# Patient Record
Sex: Male | Born: 1960 | Race: White | Hispanic: No | Smoking: Current every day smoker
Health system: Southern US, Community
[De-identification: ages and names within clinical notes are randomized; demographics above are authoritative.]

## PROBLEM LIST (undated history)

## (undated) DIAGNOSIS — E7800 Pure hypercholesterolemia, unspecified: Secondary | ICD-10-CM

## (undated) DIAGNOSIS — D496 Neoplasm of unspecified behavior of brain: Secondary | ICD-10-CM

## (undated) DIAGNOSIS — G8929 Other chronic pain: Secondary | ICD-10-CM

## (undated) DIAGNOSIS — F419 Anxiety disorder, unspecified: Secondary | ICD-10-CM

## (undated) DIAGNOSIS — M549 Dorsalgia, unspecified: Secondary | ICD-10-CM

## (undated) HISTORY — PX: BRAIN TUMOR EXCISION: SHX577

---

## 2007-10-28 ENCOUNTER — Ambulatory Visit: Payer: Self-pay | Admitting: Cardiology

## 2012-02-03 ENCOUNTER — Encounter (HOSPITAL_BASED_OUTPATIENT_CLINIC_OR_DEPARTMENT_OTHER): Payer: Self-pay | Admitting: *Deleted

## 2012-02-03 ENCOUNTER — Emergency Department (HOSPITAL_BASED_OUTPATIENT_CLINIC_OR_DEPARTMENT_OTHER): Payer: BC Managed Care – PPO

## 2012-02-03 ENCOUNTER — Emergency Department (HOSPITAL_BASED_OUTPATIENT_CLINIC_OR_DEPARTMENT_OTHER)
Admission: EM | Admit: 2012-02-03 | Discharge: 2012-02-03 | Disposition: A | Discharge status: Home / Self Care | Payer: BC Managed Care – PPO | Attending: Emergency Medicine | Admitting: Emergency Medicine

## 2012-02-03 DIAGNOSIS — F411 Generalized anxiety disorder: Secondary | ICD-10-CM | POA: Insufficient documentation

## 2012-02-03 DIAGNOSIS — K2970 Gastritis, unspecified, without bleeding: Secondary | ICD-10-CM

## 2012-02-03 DIAGNOSIS — E7800 Pure hypercholesterolemia, unspecified: Secondary | ICD-10-CM | POA: Insufficient documentation

## 2012-02-03 DIAGNOSIS — F17200 Nicotine dependence, unspecified, uncomplicated: Secondary | ICD-10-CM | POA: Insufficient documentation

## 2012-02-03 DIAGNOSIS — Z79899 Other long term (current) drug therapy: Secondary | ICD-10-CM | POA: Insufficient documentation

## 2012-02-03 DIAGNOSIS — G8929 Other chronic pain: Secondary | ICD-10-CM | POA: Insufficient documentation

## 2012-02-03 DIAGNOSIS — R002 Palpitations: Secondary | ICD-10-CM | POA: Insufficient documentation

## 2012-02-03 DIAGNOSIS — R109 Unspecified abdominal pain: Secondary | ICD-10-CM

## 2012-02-03 HISTORY — DX: Anxiety disorder, unspecified: F41.9

## 2012-02-03 HISTORY — DX: Neoplasm of unspecified behavior of brain: D49.6

## 2012-02-03 HISTORY — DX: Pure hypercholesterolemia, unspecified: E78.00

## 2012-02-03 HISTORY — DX: Dorsalgia, unspecified: M54.9

## 2012-02-03 HISTORY — DX: Other chronic pain: G89.29

## 2012-02-03 LAB — URINE MICROSCOPIC-ADD ON

## 2012-02-03 LAB — CBC WITH DIFFERENTIAL/PLATELET
Basophils Relative: 0 % (ref 0–1)
HCT: 47.8 % (ref 39.0–52.0)
Hemoglobin: 16 g/dL (ref 13.0–17.0)
Lymphocytes Relative: 17 % (ref 12–46)
Lymphs Abs: 2.8 10*3/uL (ref 0.7–4.0)
MCHC: 33.5 g/dL (ref 30.0–36.0)
Monocytes Absolute: 1.3 10*3/uL — ABNORMAL HIGH (ref 0.1–1.0)
Monocytes Relative: 8 % (ref 3–12)
Neutro Abs: 12.4 10*3/uL — ABNORMAL HIGH (ref 1.7–7.7)
Neutrophils Relative %: 75 % (ref 43–77)
RBC: 5.72 MIL/uL (ref 4.22–5.81)

## 2012-02-03 LAB — URINALYSIS, ROUTINE W REFLEX MICROSCOPIC
Hgb urine dipstick: NEGATIVE
Ketones, ur: 15 mg/dL — AB
Protein, ur: 30 mg/dL — AB
Urobilinogen, UA: 0.2 mg/dL (ref 0.0–1.0)

## 2012-02-03 LAB — COMPREHENSIVE METABOLIC PANEL
Alkaline Phosphatase: 104 U/L (ref 39–117)
BUN: 9 mg/dL (ref 6–23)
CO2: 26 mEq/L (ref 19–32)
Chloride: 99 mEq/L (ref 96–112)
Creatinine, Ser: 1.2 mg/dL (ref 0.50–1.35)
GFR calc Af Amer: 80 mL/min — ABNORMAL LOW (ref >90)
GFR calc non Af Amer: 69 mL/min — ABNORMAL LOW (ref >90)
Glucose, Bld: 104 mg/dL — ABNORMAL HIGH (ref 70–99)
Potassium: 4.3 mEq/L (ref 3.5–5.1)
Total Bilirubin: 0.3 mg/dL (ref 0.3–1.2)

## 2012-02-03 LAB — D-DIMER, QUANTITATIVE: D-Dimer, Quant: 0.27 ug/mL-FEU (ref 0.00–0.48)

## 2012-02-03 LAB — LIPASE, BLOOD: Lipase: 14 U/L (ref 11–59)

## 2012-02-03 MED ORDER — GI COCKTAIL ~~LOC~~
30.0000 mL | Freq: Once | ORAL | Status: AC
  Administered 2012-02-03: 30 mL via ORAL
  Filled 2012-02-03: qty 30

## 2012-02-03 MED ORDER — HYDROMORPHONE HCL PF 1 MG/ML IJ SOLN
1.0000 mg | Freq: Once | INTRAMUSCULAR | Status: AC
  Administered 2012-02-03: 1 mg via INTRAVENOUS
  Filled 2012-02-03: qty 1

## 2012-02-03 MED ORDER — PANTOPRAZOLE SODIUM 20 MG PO TBEC: 20.0000 mg | DELAYED_RELEASE_TABLET | Freq: Every day | ORAL | Status: AC

## 2012-02-03 MED ORDER — SUCRALFATE 1 G PO TABS: 1.0000 g | ORAL_TABLET | Freq: Four times a day (QID) | ORAL | Status: AC

## 2012-02-03 NOTE — ED Notes (Signed)
Abdominal pain. Pain started 2 days ago. He told the EMT his MD sent him here to have his testosteone level checked. Pt tells me he only has an ENT in [place] that he did not call.

## 2012-02-03 NOTE — ED Provider Notes (Signed)
History     CSN: [id number]  Arrival date & time 02/03/12  1215   First MD Initiated Contact with Patient 02/03/12 1348      Chief Complaint  Patient presents with  . Abdominal Pain  . Palpitations    (Consider location/radiation/quality/duration/timing/severity/associated sxs/prior treatment) HPI Patient complaining of ruq pain began Saturday and became sever Saturday pm.  Patient feels like he has palpitations when sharp pain comes in ruq and radiates across abdomen.  Patient has some dyspnea at baseline but has not worsening with symptoms.  No cough.  History of brain tumors/p resection.  No neurocomplaints. NO vomiting or diarrhea but he has decreased appetite although he ate pizza yesterday.   Past Medical History  Diagnosis Date  . Chronic back pain   . Brain tumor   . High cholesterol   . Anxiety     History reviewed. No pertinent past surgical history.  No family history on file.  History  Substance Use Topics  . Smoking status: Current Every Day Smoker -- 0.5 packs/day    Types: Cigarettes  . Smokeless tobacco: Not on file  . Alcohol Use: No      Review of Systems  Constitutional: Positive for appetite change. Negative for fever and chills.  HENT: Negative for neck stiffness.   Eyes: Negative for visual disturbance.  Respiratory: Negative for shortness of breath.   Cardiovascular: Negative for chest pain.  Gastrointestinal: Positive for abdominal pain. Negative for vomiting, diarrhea and blood in stool.  Genitourinary: Negative for dysuria, frequency and decreased urine volume.  Musculoskeletal: Negative for myalgias and joint swelling.  Skin: Negative for rash.  Neurological: Negative for weakness.  Hematological: Negative for adenopathy.  Psychiatric/Behavioral: Negative for agitation.    Allergies  Review of patient's allergies indicates no known allergies.  Home Medications   Current Outpatient Rx  Name  Route  Sig  Dispense  Refill  .  ALPRAZOLAM 1 MG PO TABS   Oral   Take 1 mg by mouth at bedtime as needed.         Marland Kitchen LIPITOR PO   Oral   Take by mouth.         Marland Kitchen PROVIGIL PO   Oral   Take by mouth.         . OXYCODONE HCL PO   Oral   Take by mouth.           BP 134/90  Pulse 100  Temp 98.5 F (36.9 C) (Oral)  Resp 20  Ht 5\' 8"  (1.727 m)  Wt 198 lb (89.812 kg)  BMI 30.11 kg/m2  SpO2 98%  Physical Exam  Nursing note and vitals reviewed. Constitutional: He is oriented to person, place, and time. He appears well-developed and well-nourished.  HENT:  Head: Normocephalic and atraumatic.  Right Ear: External ear normal.  Left Ear: External ear normal.  Mouth/Throat: Oropharynx is clear and moist.  Eyes: Conjunctivae normal and EOM are normal. Pupils are equal, round, and reactive to light.  Neck: Normal range of motion. Neck supple.  Cardiovascular: Normal rate, regular rhythm and normal heart sounds.   Pulmonary/Chest: Effort normal and breath sounds normal. No respiratory distress.  Abdominal: Soft. Bowel sounds are normal.  Musculoskeletal: Normal range of motion.  Neurological: He is alert and oriented to person, place, and time. He has normal reflexes.  Skin: Skin is warm and dry.  Psychiatric: He has a normal mood and affect.    ED Course  Procedures (including critical care  time)  Labs Reviewed  URINALYSIS, ROUTINE W REFLEX MICROSCOPIC - Abnormal; Notable for the following:    Color, Urine AMBER (*)  BIOCHEMICALS MAY BE AFFECTED BY COLOR   APPearance CLOUDY (*)     Bilirubin Urine SMALL (*)     Ketones, ur 15 (*)     Protein, ur 30 (*)     All other components within normal limits  URINE MICROSCOPIC-ADD ON - Abnormal; Notable for the following:    Bacteria, UA FEW (*)     All other components within normal limits   No results found.   No diagnosis found.   Date: 02/03/2012  Rate: 104  Rhythm: sinus tachycardia  QRS Axis: normal  Intervals: normal  ST/T Wave  abnormalities: nonspecific ST changes  Conduction Disutrbances:none  Narrative Interpretation:   Old EKG Reviewed: none available    MDM    Patient has ultrasound pending.  Care discussed with Dr. Fredderick Phenix and she will review results of ultrasound.       Hilario Quarry, MD 02/03/12 1534

## 2012-02-03 NOTE — ED Provider Notes (Signed)
Results for orders placed during the hospital encounter of 02/03/12  URINALYSIS, ROUTINE W REFLEX MICROSCOPIC      Component Value Range   Color, Urine AMBER (*) YELLOW   APPearance CLOUDY (*) CLEAR   Specific Gravity, Urine 1.018  1.005 - 1.030   pH 6.0  5.0 - 8.0   Glucose, UA NEGATIVE  NEGATIVE mg/dL   Hgb urine dipstick NEGATIVE  NEGATIVE   Bilirubin Urine SMALL (*) NEGATIVE   Ketones, ur 15 (*) NEGATIVE mg/dL   Protein, ur 30 (*) NEGATIVE mg/dL   Urobilinogen, UA 0.2  0.0 - 1.0 mg/dL   Nitrite NEGATIVE  NEGATIVE   Leukocytes, UA NEGATIVE  NEGATIVE  URINE MICROSCOPIC-ADD ON      Component Value Range   Squamous Epithelial / LPF RARE  RARE   WBC, UA 0-2  <3 WBC/hpf   RBC / HPF 0-2  <3 RBC/hpf   Bacteria, UA FEW (*) RARE   Urine-Other MUCOUS PRESENT    CBC WITH DIFFERENTIAL      Component Value Range   WBC 16.6 (*) 4.0 - 10.5 K/uL   RBC 5.72  4.22 - 5.81 MIL/uL   Hemoglobin 16.0  13.0 - 17.0 g/dL   HCT 40.9  81.1 - 91.4 %   MCV 83.6  78.0 - 100.0 fL   MCH 28.0  26.0 - 34.0 pg   MCHC 33.5  30.0 - 36.0 g/dL   RDW 78.2 (*) 95.6 - 21.3 %   Platelets 287  150 - 400 K/uL   Neutrophils Relative 75  43 - 77 %   Neutro Abs 12.4 (*) 1.7 - 7.7 K/uL   Lymphocytes Relative 17  12 - 46 %   Lymphs Abs 2.8  0.7 - 4.0 K/uL   Monocytes Relative 8  3 - 12 %   Monocytes Absolute 1.3 (*) 0.1 - 1.0 K/uL   Eosinophils Relative 0  0 - 5 %   Eosinophils Absolute 0.1  0.0 - 0.7 K/uL   Basophils Relative 0  0 - 1 %   Basophils Absolute 0.0  0.0 - 0.1 K/uL  COMPREHENSIVE METABOLIC PANEL      Component Value Range   Sodium 138  135 - 145 mEq/L   Potassium 4.3  3.5 - 5.1 mEq/L   Chloride 99  96 - 112 mEq/L   CO2 26  19 - 32 mEq/L   Glucose, Bld 104 (*) 70 - 99 mg/dL   BUN 9  6 - 23 mg/dL   Creatinine, Ser 0.86  0.50 - 1.35 mg/dL   Calcium 9.4  8.4 - 57.8 mg/dL   Total Protein 7.2  6.0 - 8.3 g/dL   Albumin 3.7  3.5 - 5.2 g/dL   AST 15  0 - 37 U/L   ALT 15  0 - 53 U/L   Alkaline  Phosphatase 104  39 - 117 U/L   Total Bilirubin 0.3  0.3 - 1.2 mg/dL   GFR calc non Af Amer 69 (*) >90 mL/min   GFR calc Af Amer 80 (*) >90 mL/min  LIPASE, BLOOD      Component Value Range   Lipase 14  11 - 59 U/L  D-DIMER, QUANTITATIVE      Component Value Range   D-Dimer, Quant 0.27  0.00 - 0.48 ug/mL-FEU  TROPONIN I      Component Value Range   Troponin I <0.30  <0.30 ng/mL   Dg Chest 2 View  02/03/2012  *RADIOLOGY REPORT*  Clinical  Data: Pain  CHEST - 2 VIEW  Comparison: None.  Findings: The heart and pulmonary vascularity are within normal limits.  The lungs are clear bilaterally.  No acute bony abnormality is seen.  IMPRESSION: No acute abnormality noted.   Original Report Authenticated By: Alcide Clever, M.D.    Dg Abd 1 View  02/03/2012  *RADIOLOGY REPORT*  Clinical Data: Abdominal pain and palpitations.  Right upper quadrant pain.  ABDOMEN - 1 VIEW  Comparison:  None.  Findings: The radiograph was obtained in the upright view only. The bowel gas pattern is normal.  No radio-opaque calculi or other significant radiographic abnormality is seen. Moderate stool burden with possible retained barium in the right colon.  IMPRESSION: No obstruction or free air.   Original Report Authenticated By: Davonna Belling, M.D.    US Abdomen Complete  02/03/2012  *RADIOLOGY REPORT*  Clinical Data:  Right upper quadrant pain radiating to chest for 2- 3 days, history smoking, brain tumor, hypercholesterolemia, chronic back pain  ULTRASOUND ABDOMEN:  Technique:  Sonography of upper abdominal structures was performed.  Comparison:  None  Gallbladder:  Normally distended without stones or wall thickening. No pericholecystic fluid or sonographic Murphy sign.  Common bile duct:  Normal caliber 1.4 mm diameter  Liver:  Normal appearance  IVC:  Visualized intrahepatic portion of the IVC is normal in appearance, remainder obscured by bowel gas  Pancreas:  Obscured by bowel gas  Spleen:  Normal appearance, 5.7 cm  length  Right kidney:  11.5 cm length. Normal morphology without mass or hydronephrosis.  Left kidney:  11.5 cm length probable peripelvic renal cyst 2.0 x 1.9 x 1.7 cm.  Aorta:  Normal caliber  Other:  No free fluid  IMPRESSION: Incomplete visualization of the IVC and nonvisualization of the pancreas due to obscuration by bowel gas. Remainder of exam unremarkable.   Original Report Authenticated By: Ulyses Southward, M.D.     U/s negative.  On re-exam, pt tender in epigastric area.  Nothing to suggest pancreatitis.  No active vomiting.  Pain resolved with GI cocktail.  Will d/c with protonix, carafate.  Pt to f/u with his PMD in 2 days or return here if symptoms worsen.  Rolan Bucco, MD 02/03/12 1755

## 2013-10-14 ENCOUNTER — Other Ambulatory Visit: Payer: Self-pay | Admitting: Gastroenterology

## 2013-10-14 DIAGNOSIS — R109 Unspecified abdominal pain: Secondary | ICD-10-CM

## 2013-10-14 DIAGNOSIS — K625 Hemorrhage of anus and rectum: Secondary | ICD-10-CM

## 2013-10-15 ENCOUNTER — Encounter (HOSPITAL_COMMUNITY): Payer: Self-pay

## 2013-10-15 ENCOUNTER — Ambulatory Visit (HOSPITAL_COMMUNITY)
Admission: RE | Admit: 2013-10-15 | Discharge: 2013-10-15 | Disposition: A | Discharge status: Home / Self Care | Payer: BC Managed Care – PPO | Source: Ambulatory Visit | Attending: Gastroenterology | Admitting: Gastroenterology

## 2013-10-15 DIAGNOSIS — N281 Cyst of kidney, acquired: Secondary | ICD-10-CM | POA: Insufficient documentation

## 2013-10-15 DIAGNOSIS — R109 Unspecified abdominal pain: Secondary | ICD-10-CM | POA: Insufficient documentation

## 2013-10-15 DIAGNOSIS — K625 Hemorrhage of anus and rectum: Secondary | ICD-10-CM | POA: Insufficient documentation

## 2013-10-15 MED ORDER — IOHEXOL 300 MG/ML  SOLN: 50.0000 mL | Freq: Once | INTRAMUSCULAR | Status: DC | PRN

## 2013-10-15 MED ORDER — IOHEXOL 300 MG/ML  SOLN
100.0000 mL | Freq: Once | INTRAMUSCULAR | Status: AC | PRN
  Administered 2013-10-15: 100 mL via INTRAVENOUS

## 2013-11-10 IMAGING — CR DG CHEST 2V
2 series · 2 of 2 positions shown · non-contrast
Comparison: None.

CLINICAL DATA: Pain

CHEST - 2 VIEW

[w chest pa]
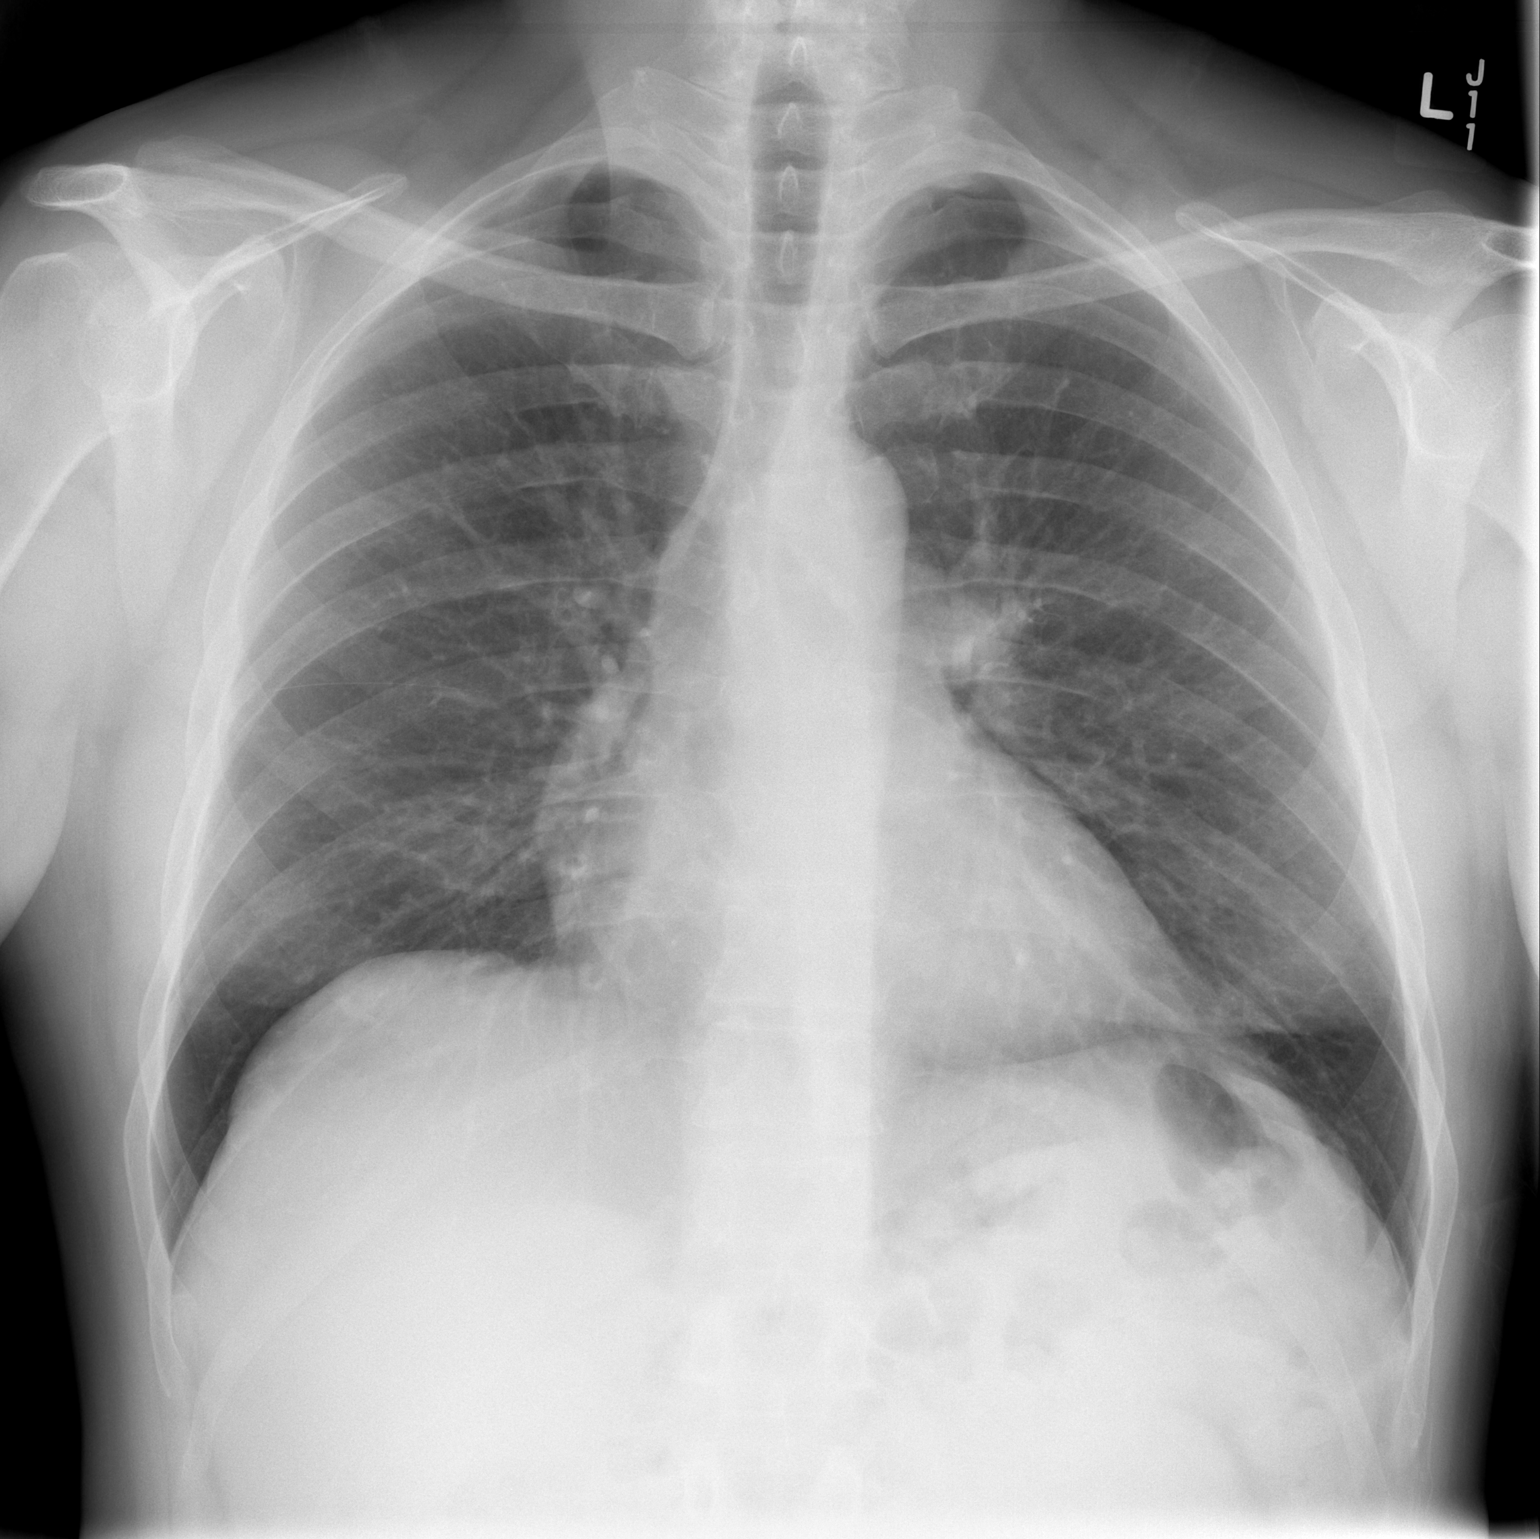

[w chest lat]
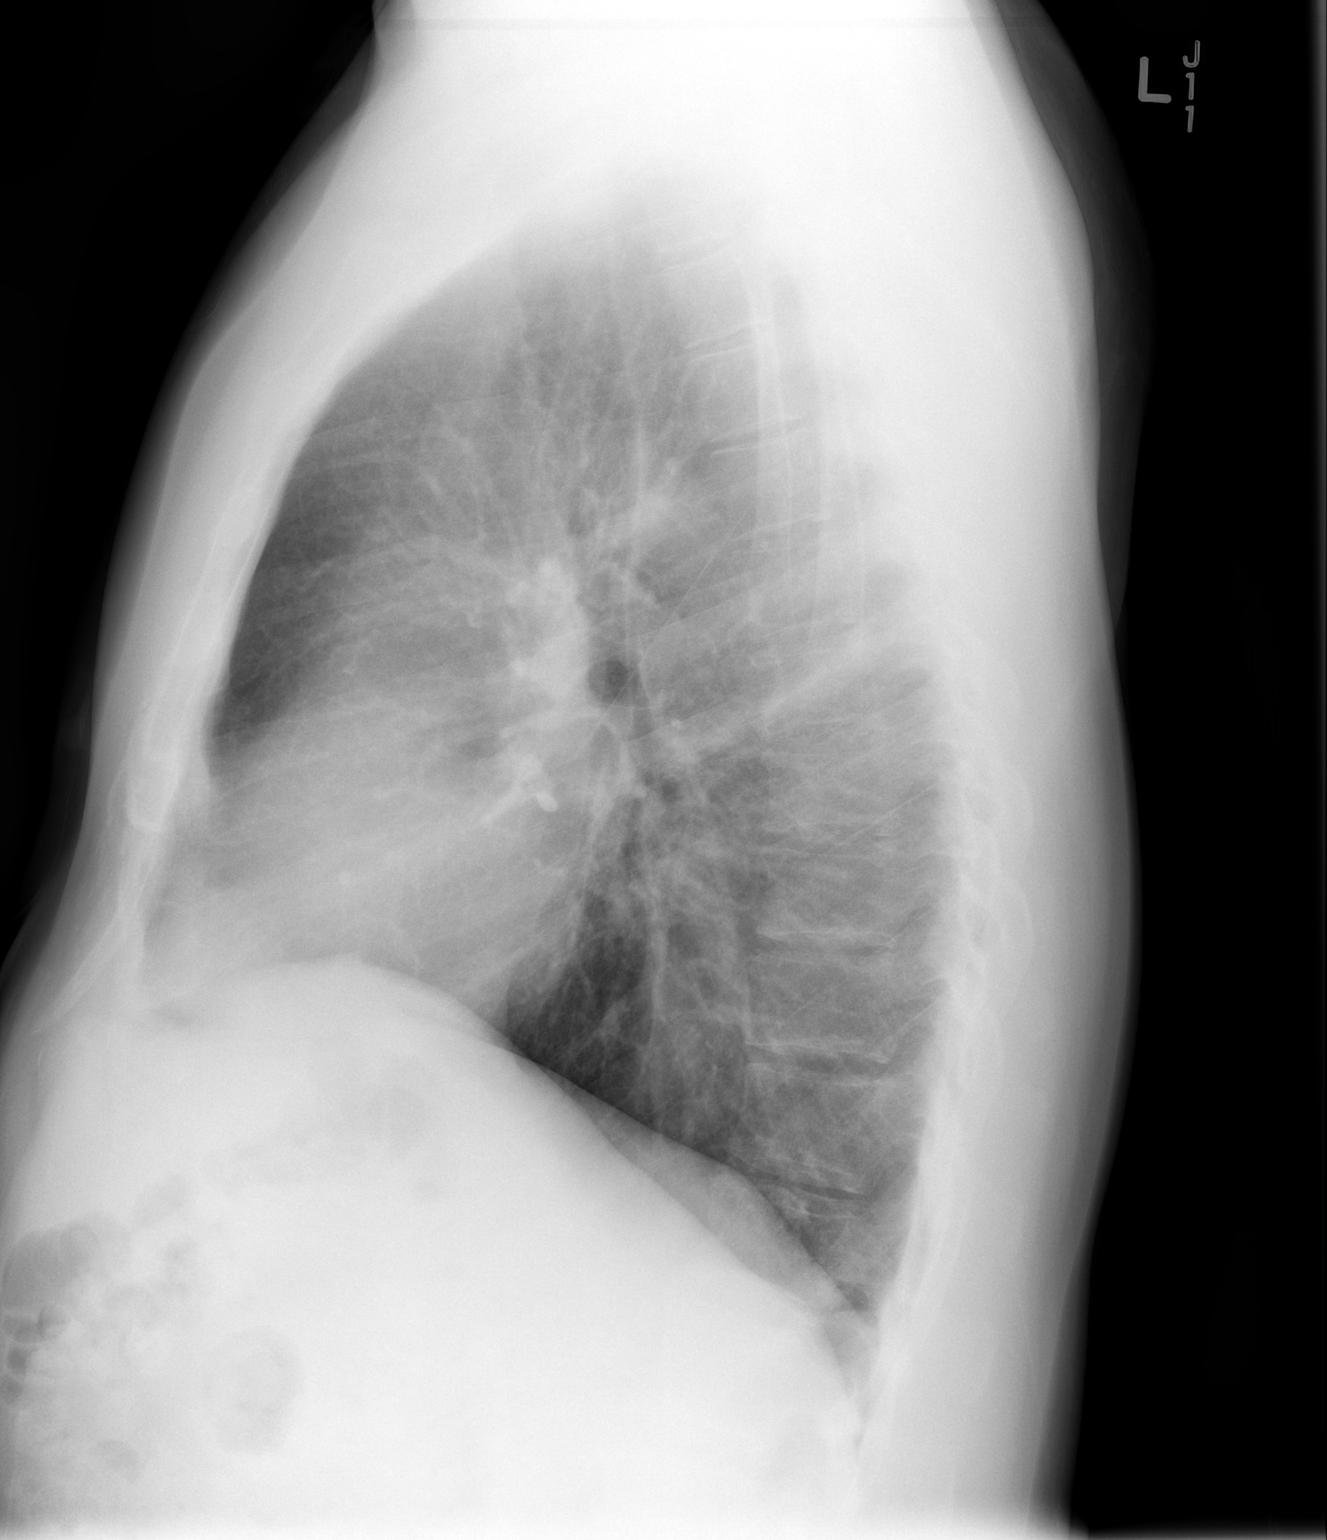

[2 of 2 positions shown; findings below may reference images not displayed]

FINDINGS: The heart and pulmonary vascularity are within normal
limits.  The lungs are clear bilaterally.  No acute bony
abnormality is seen.
IMPRESSION: No acute abnormality noted.

## 2013-11-10 IMAGING — US US ABDOMEN COMPLETE
1 series · 14 of 25 positions shown · non-contrast
Comparison: None

CLINICAL DATA: Right upper quadrant pain radiating to chest for 2-
3 days, history smoking, brain tumor, hypercholesterolemia, chronic
back pain

ULTRASOUND ABDOMEN:
TECHNIQUE: Sonography of upper abdominal structures was performed.

[Series 1: us abdomen complete · 0.35mm/px · 14 of 89 slices shown]
[im 1/89]
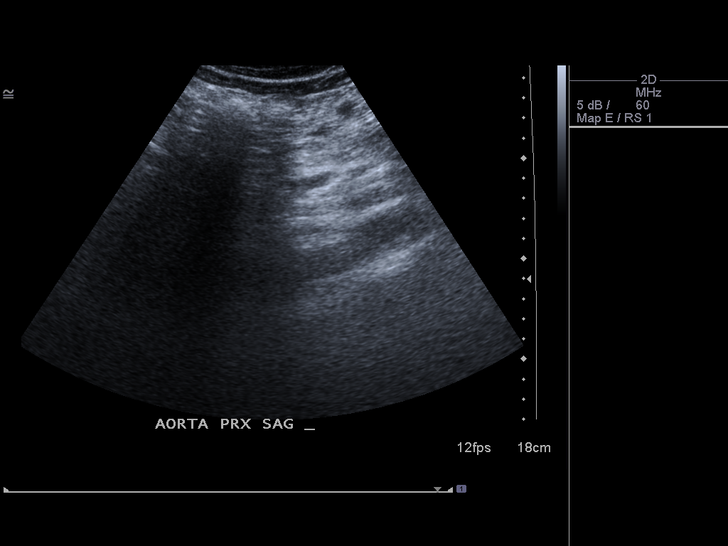
[im 8/89]
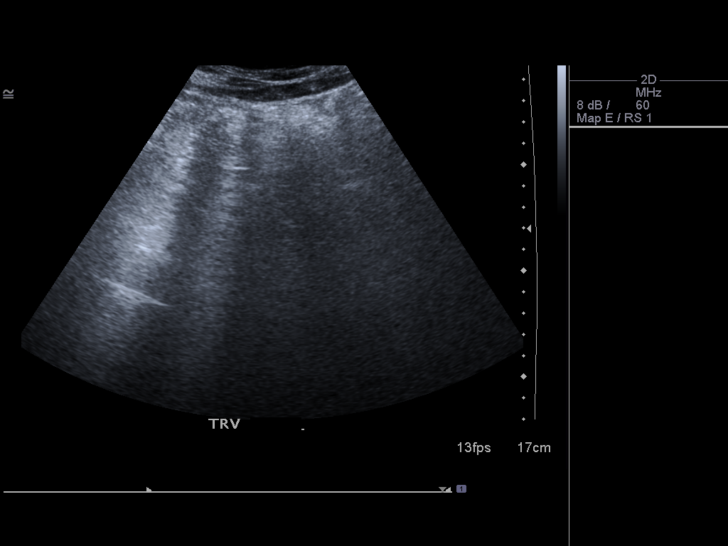
[im 15/89]
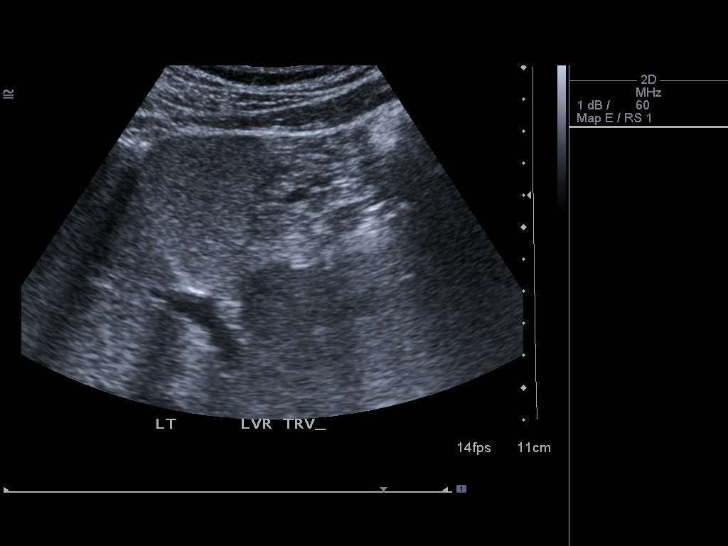
[im 23/89]
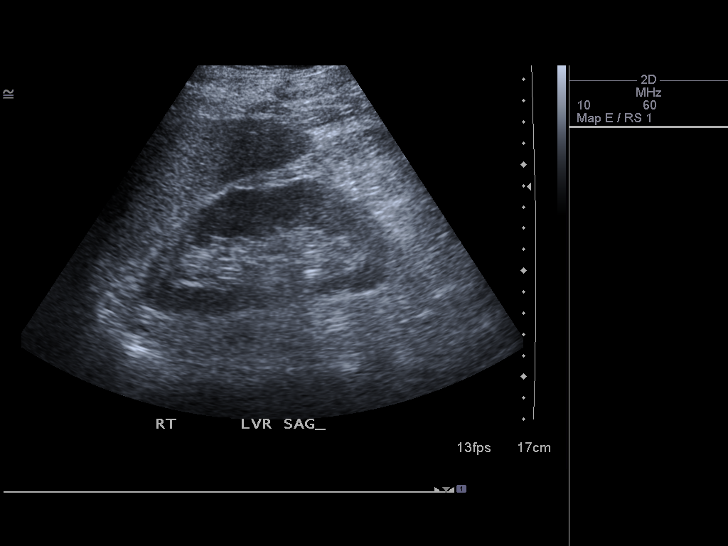
[im 30/89]
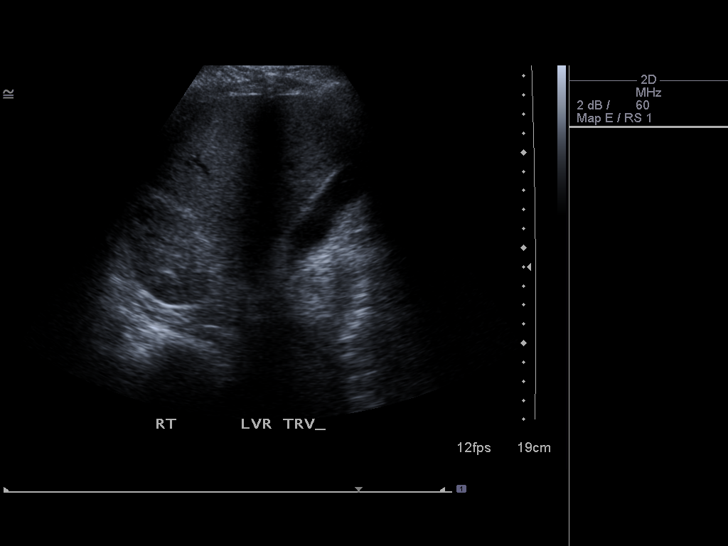
[im 34/89]
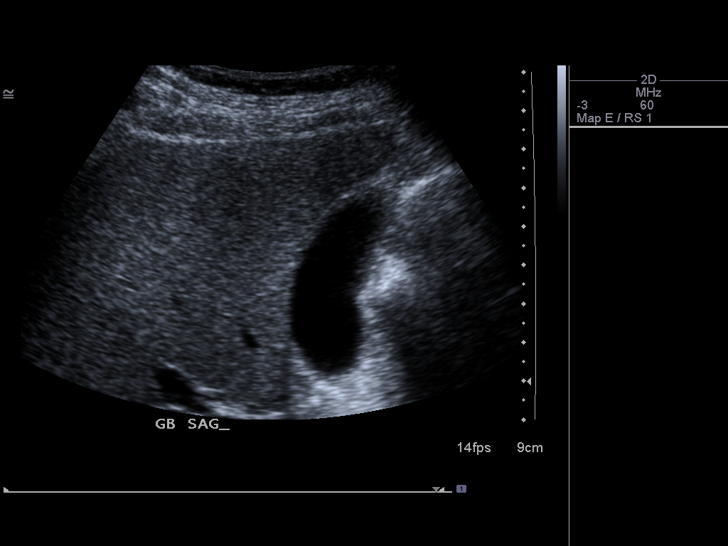
[im 41/89]
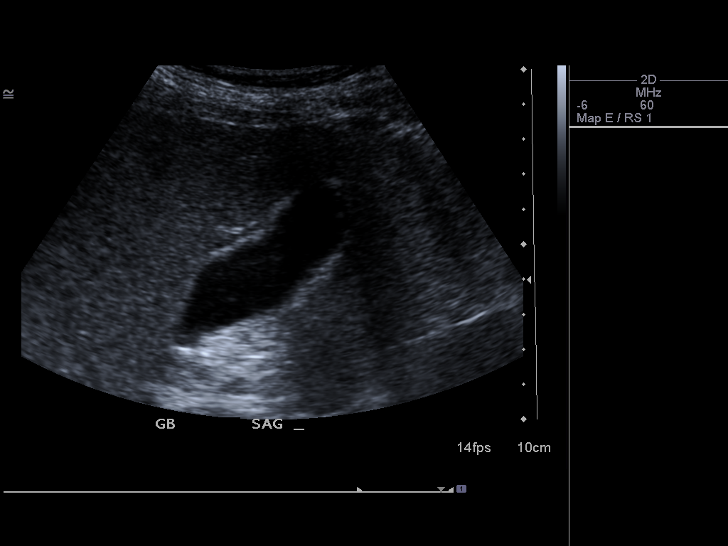
[im 48/89]
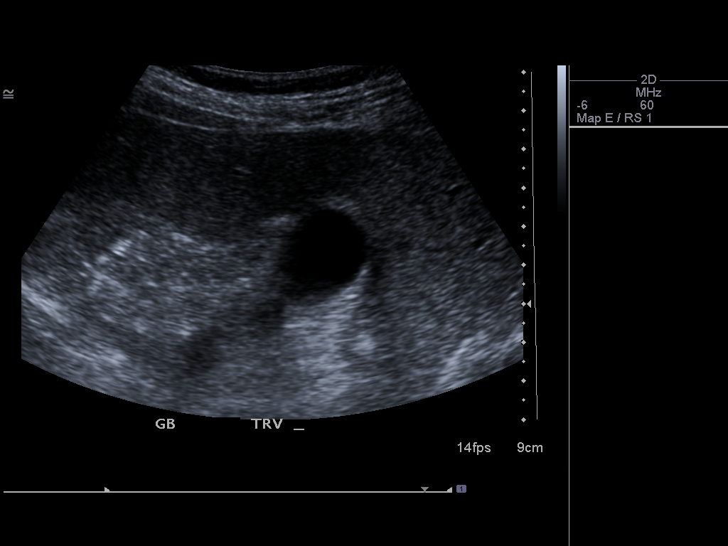
[im 56/89]
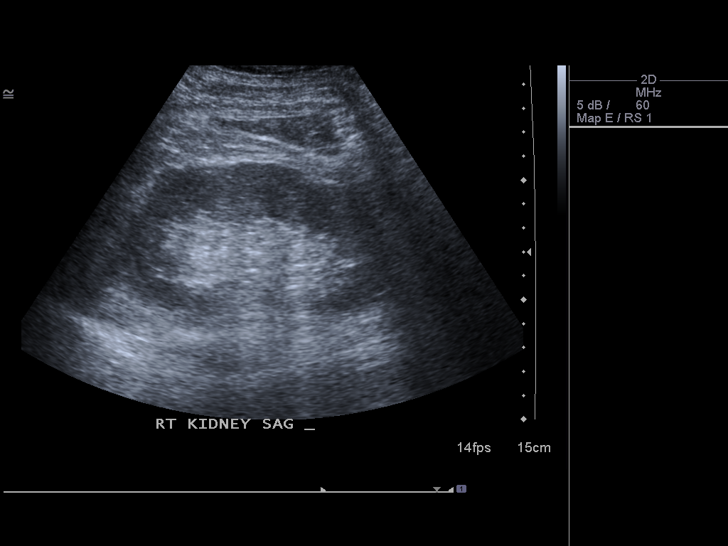
[im 59/89]
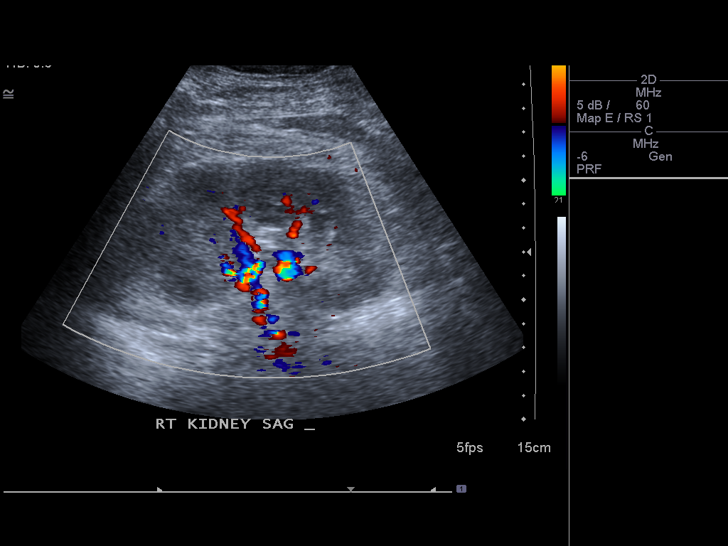
[im 67/89]
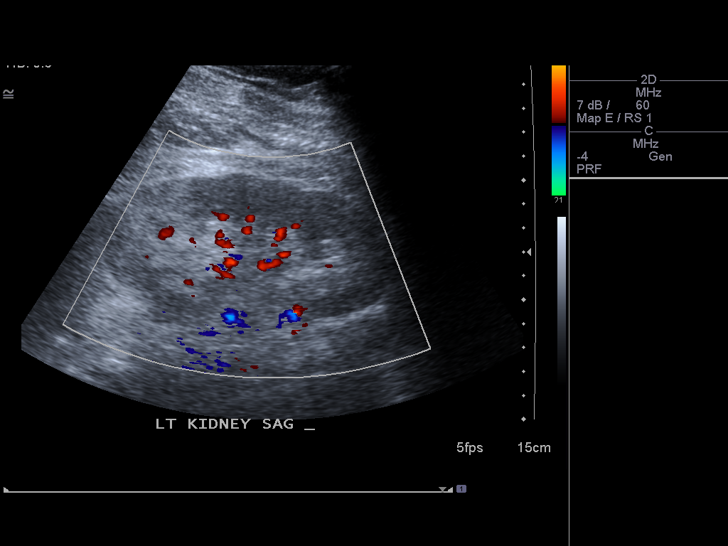
[im 74/89]
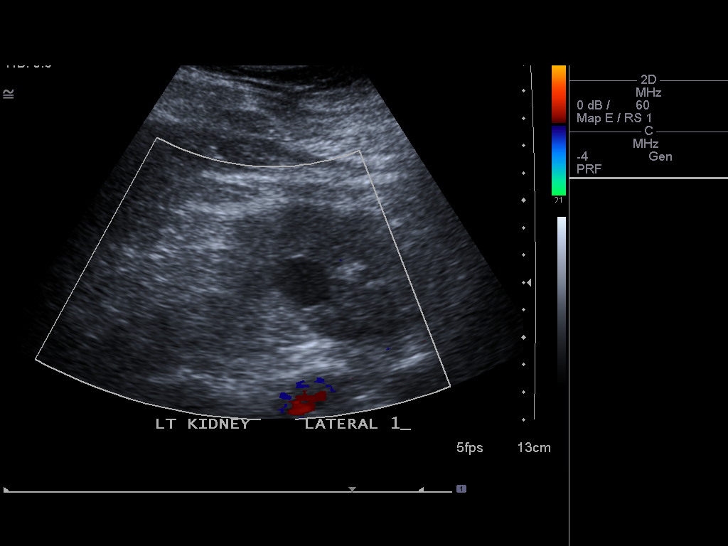
[im 81/89]
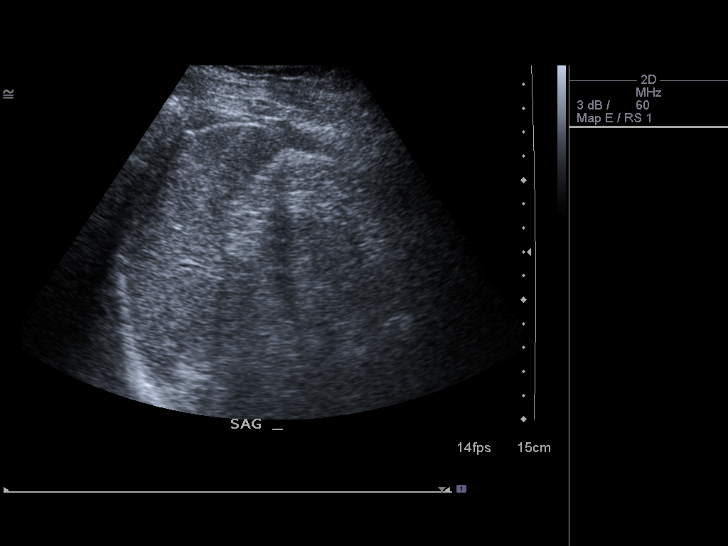
[im 89/89]
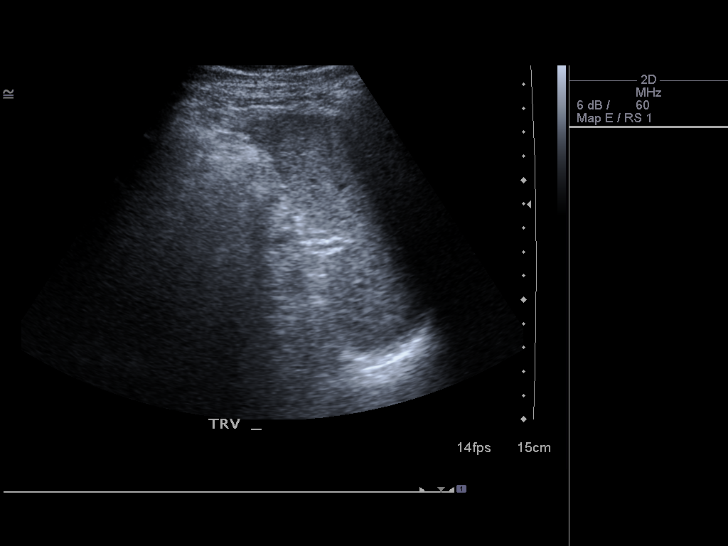

[14 of 25 positions shown; findings below may reference images not displayed]

Gallbladder:  Normally distended without stones or wall thickening.
No pericholecystic fluid or sonographic Murphy sign.

Common bile duct:  Normal caliber 1.4 mm diameter

Liver:  Normal appearance

IVC:  Visualized intrahepatic portion of the IVC is normal in
appearance, remainder obscured by bowel gas

Pancreas:  Obscured by bowel gas

Spleen:  Normal appearance, 5.7 cm length

Right kidney:  11.5 cm length. Normal morphology without mass or
hydronephrosis.

Left kidney:  11.5 cm length probable peripelvic renal cyst 2.0 x
1.9 x 1.7 cm.

Aorta:  Normal caliber

Other:  No free fluid
IMPRESSION: Incomplete visualization of the IVC and nonvisualization of the
pancreas due to obscuration by bowel gas.
Remainder of exam unremarkable.

## 2014-02-12 ENCOUNTER — Telehealth: Payer: Self-pay

## 2014-02-12 NOTE — Telephone Encounter (Signed)
Pt's girlfriend called (dana who works here) He had a CPE at [facility] in [place] He was told he had an irregular heart beat He is scheduled to have a stress test.  Hinton Dyer is worried about him. Advised ER tonight or to come in first thing in the morning to see Dr. Carlean Jews Pt's girlfriend agreeable.

## 2014-02-14 ENCOUNTER — Ambulatory Visit (INDEPENDENT_AMBULATORY_CARE_PROVIDER_SITE_OTHER): Payer: BC Managed Care – PPO | Admitting: Internal Medicine

## 2014-02-14 VITALS — BP 138/86 | HR 86 | Temp 98.0°F | Resp 16 | Ht 69.0 in | Wt 215.2 lb

## 2014-02-14 DIAGNOSIS — R9431 Abnormal electrocardiogram [ECG] [EKG]: Secondary | ICD-10-CM

## 2014-02-14 DIAGNOSIS — G8929 Other chronic pain: Secondary | ICD-10-CM

## 2014-02-14 DIAGNOSIS — M549 Dorsalgia, unspecified: Secondary | ICD-10-CM

## 2014-02-14 DIAGNOSIS — R002 Palpitations: Secondary | ICD-10-CM

## 2014-02-14 DIAGNOSIS — F17210 Nicotine dependence, cigarettes, uncomplicated: Secondary | ICD-10-CM

## 2014-02-14 DIAGNOSIS — G479 Sleep disorder, unspecified: Secondary | ICD-10-CM

## 2014-02-14 DIAGNOSIS — Z789 Other specified health status: Secondary | ICD-10-CM

## 2014-02-14 DIAGNOSIS — I6522 Occlusion and stenosis of left carotid artery: Secondary | ICD-10-CM

## 2014-02-14 DIAGNOSIS — R7989 Other specified abnormal findings of blood chemistry: Secondary | ICD-10-CM

## 2014-02-14 DIAGNOSIS — D332 Benign neoplasm of brain, unspecified: Secondary | ICD-10-CM

## 2014-02-14 DIAGNOSIS — R5383 Other fatigue: Secondary | ICD-10-CM

## 2014-02-14 DIAGNOSIS — E785 Hyperlipidemia, unspecified: Secondary | ICD-10-CM

## 2014-02-14 DIAGNOSIS — R945 Abnormal results of liver function studies: Secondary | ICD-10-CM

## 2014-02-14 DIAGNOSIS — R079 Chest pain, unspecified: Secondary | ICD-10-CM

## 2014-02-14 MED ORDER — CYCLOBENZAPRINE HCL 10 MG PO TABS: 10.0000 mg | ORAL_TABLET | Freq: Every day | ORAL | Status: DC

## 2014-02-14 NOTE — Progress Notes (Signed)
Subjective:  This chart was scribed for Jason Orozco. Laney Pastor, MD by Terressa Koyanagi, ED Scribe. This patient was seen in room 10 and the patient's care was started at 6:36 PM.  1st [facility] OV.   Patient ID: Jason Orozco, male    DOB: 21-Aug-1960, 53 y.o.   MRN: [id number] Chief Complaint  Patient presents with  . Irregular Heart Beat    HPI PCP: BUTLER, CYNTHIA, DO HPI Comments: Jason Orozco is a 53 y.o. male, with medical Hx noted below and significant for degenerative disc disease (pain management, oxycodone), left carotid stenosis, brain tumor, tobacco use (0.5 ppd), and high cholesterol, who presents to the [facility] complaining of an irregular heartbeat. Pt reports he had a CPE at [facility] [place] and he was told that he had an irregular heartbeat. He had a stress test 8 years ago that was normal. Pt notes tobacco use increases his heart rate. Pt further notes that ingesting sleep aids causes palpitations. Pt reports he had labs and imaging in conjunction with lifestyle screening done at work and they documented a 50% blockage of his left carotid.  His PCP is in [place] and f/u set for 12/15. Sunday had episode of palpitations lasting 62mins or longer soon after taking cialis--wife says pulse too high to count. No chest pain, sob or diaphoresis or dizziness associated--just felt "uncomfortable".  Wheezing: Pt reports wheezing at baseline thought due to long tobacco hx.. 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 Not much physical exertion at home either.30-40 lb wt gain last 3 years.   Fatigue: Pt also complains of increased fatigue for the past 6 months. Not really a DOE but tires easily.  Trouble Sleeping: Pt reports that he has had trouble falling asleep since childhood. Further, Pt reports he worked 6th and 7th shift for the past 18 years, only recently switching to 1st shift. Restless sleeper  waking frequently but without snoring, observed apnea, daytime hypersomnolence. Sometimes when he lies down to sleep, he feels like he needs to stretch his arms and legs -no cramps or restless movement. Pt denies muscle tightness. Pt states it "feels like my body is trying to get rid of something." Pt notes, at times, this sensation keeps him from falling asleep.  Family Hx: Pt reports his mother had heart problems and had couple of HAs, an aneurism and died at the age of 53 from a massive HA. Pt reports he has a 61 yo brother who is healthy. Pt notes his father has been a diabetic since his 56s.    Past Medical History  Diagnosis Date  . Chronic back pain   . Brain tumor---benign s/p rx w/out sequelae   . High cholesterol   . Anxiety    No past surgical history on file.  Current Outpatient Prescriptions on File Prior to Visit  Medication Sig Dispense Refill  . ALPRAZolam (XANAX) 1 MG tablet Take 1 mg by mouth at bedtime as needed. Prn for anxiety   . Atorvastatin Calcium (LIPITOR PO) Take by mouth.    . Modafinil (PROVIGIL PO) Take by mouth. Started by pcp for improving alertness   . OXYCODONE HCL PO Take by mouth. Chr pain rx for Back issues   . pantoprazole (PROTONIX) 20 MG tablet Take 1 tablet (20 mg total) by mouth daily.    . sucralfate (CARAFATE) 1 G tablet Take 1 tablet (1 g  total) by mouth 4 (four) times daily.     No current facility-administered medications on file prior to visit.    Review of Systems  Constitutional: Positive for fatigue. Negative for fever and chills.  Respiratory: Positive for wheezing (at baseline).   Cardiovascular: Positive for palpitations.  Psychiatric/Behavioral: Positive for sleep disturbance (trouble falling asleep at baseline). Negative for confusion.      Objective:   Physical Exam  Constitutional: He is oriented to person, place, and time. He appears well-developed and well-nourished. No distress.  HENT:  Head: Normocephalic and  atraumatic.  Eyes: Conjunctivae and EOM are normal. Pupils are equal, round, and reactive to light.  Neck: Normal range of motion. Neck supple. No thyromegaly present.  Cardiovascular: Normal rate, regular rhythm, normal heart sounds and intact distal pulses.  Exam reveals no gallop.   No murmur heard. Pulmonary/Chest: Effort normal and breath sounds normal. No respiratory distress. He has no wheezes. He has no rales.  Abdominal: Soft. Bowel sounds are normal. He exhibits no mass. There is no tenderness. There is no guarding.  Musculoskeletal: Normal range of motion. He exhibits no edema.  Neurological: He is alert and oriented to person, place, and time. No cranial nerve deficit. He exhibits normal muscle tone.  Skin: Skin is warm and dry.  Psychiatric: He has a normal mood and affect. His behavior is normal.  Nursing note and vitals reviewed.  Triage Vitals:  Filed Vitals:   02/14/14 1826  BP: 138/86  Pulse: 86  Temp: 98 F (36.7 C)  Resp: 16   EKG with some slowed conduction but no acute injury or arrythmia CXR reported as wnl at PE 3 weeks ago.Labs also x abn lfts which have been up before.    Assessment & Plan:  6:45 PM-Discussed treatment plan which includes discontinue use of  meds (muscle relaxant drugs), EKG results, stress test, cardiology referral, sleep study, steps to eliminate tobacco use, and temporarily halt use of cialis with pt at bedside and pt agreed to plan. Pt notes that while he agrees to see a cardiologist, he does not wish to see one until after the holidays.   Chest pain, unspecified chest pain type -  Palpitations -  Other fatigue -  Cigarette nicotine dependence   Abnormal LFTs by hx  Hyperlipidemia -   Sleep disorder NOS---will need testing eventually  Chronic back pain  Alcohol consumption four to six days per week  Nonspecific abnormal electrocardiogram (ECG) (EKG)  Carotid stenosis, left - lifestyle screening ~2011 50% asympto  Benign  brain tumor - ~2007--no sequelae   Will refer to Dr Einar Gip for evaluation --stress testing indicated !!  I personally performed the services described in this documentation, which was scribed in my presence. The recorded information has been reviewed and is accurate.

## 2014-05-02 ENCOUNTER — Ambulatory Visit (INDEPENDENT_AMBULATORY_CARE_PROVIDER_SITE_OTHER): Payer: BLUE CROSS/BLUE SHIELD | Admitting: Pulmonary Disease

## 2014-05-02 ENCOUNTER — Encounter: Payer: Self-pay | Admitting: Pulmonary Disease

## 2014-05-02 VITALS — BP 118/70 | HR 76 | Temp 97.0°F | Ht 68.0 in | Wt 222.0 lb

## 2014-05-02 DIAGNOSIS — R0609 Other forms of dyspnea: Secondary | ICD-10-CM

## 2014-05-02 NOTE — Progress Notes (Signed)
   Subjective:    Patient ID: Jason Orozco, male    DOB: 1960/06/18, 54 y.o.   MRN: [id number]  HPI The patient is a 54 year old male who I've been asked to see for dyspnea on exertion. He is been having increasing shortness of breath with activity for the last 6 months, but has noticed that it is improved with decreasing his quantity of smoking. He describes a 2 block dyspnea on exertion at a moderate pace on flat ground, but does not get winded with a flight of stairs or carrying groceries in from the car. He believes his dyspnea is primarily related to moderate to heavy types of activities. He denies any significant cough or mucus production since he is decrease smoking, and has not had lower extremity edema. He states that his weight is up 20 pounds over the last one year. He has had a thorough cardiac workup with a normal echo and a low risk nuclear stress test in December of last year. He tells me that he has had a chest x-ray in the fall of last year, but it is not available currently. He has not had pulmonary function studies.   Review of Systems  Constitutional: Negative for fever and unexpected weight change.  HENT: Negative for congestion, dental problem, ear pain, nosebleeds, postnasal drip, rhinorrhea, sinus pressure, sneezing, sore throat and trouble swallowing.   Eyes: Negative for redness and itching.  Respiratory: Positive for cough, shortness of breath and wheezing. Negative for chest tightness.   Cardiovascular: Negative for palpitations and leg swelling.  Gastrointestinal: Negative for nausea and vomiting.  Genitourinary: Negative for dysuria.  Musculoskeletal: Negative for joint swelling.  Skin: Negative for rash.  Neurological: Negative for headaches.  Hematological: Does not bruise/bleed easily.  Psychiatric/Behavioral: Negative for dysphoric mood. The patient is not nervous/anxious.        Objective:   Physical Exam Constitutional:  Obese male, no acute  distress  HENT:  Nares patent without discharge  Oropharynx without exudate, palate and uvula are normal  Eyes:  Perrla, eomi, no scleral icterus  Neck:  No JVD, no TMG  Cardiovascular:  Normal rate, regular rhythm, no rubs or gallops.  No murmurs        Intact distal pulses  Pulmonary :  Normal breath sounds, no stridor or respiratory distress   No rales, rhonchi, or wheezing  Abdominal:  Soft, nondistended, bowel sounds present.  No tenderness noted.   Musculoskeletal:  No lower extremity edema noted.  Lymph Nodes:  No cervical lymphadenopathy noted  Skin:  No cyanosis noted  Neurologic:  Alert, appropriate, moves all 4 extremities without obvious deficit.         Assessment & Plan:

## 2014-05-02 NOTE — Assessment & Plan Note (Signed)
The patient is having dyspnea on exertion with primarily more moderate to heavy types of activities. It is unclear how much of this is related to his obesity/weight gain, deconditioning, or if he may have underlying obstructive disease related to his smoking. He thinks that his breathing is much improved since cutting back on his smoking, and his cough and mucus production is not impressive. I would like to do pulmonary function studies to evaluate for airflow obstruction, and I've encouraged him to stop smoking 100%.

## 2014-05-02 NOTE — Patient Instructions (Signed)
Will schedule for breathing studies, and see you back the same day for review. Work on stopping smoking 100%. Work on weight loss and becoming more active.

## 2014-05-20 ENCOUNTER — Inpatient Hospital Stay (HOSPITAL_COMMUNITY): Admission: RE | Admit: 2014-05-20 | Payer: BLUE CROSS/BLUE SHIELD | Source: Ambulatory Visit

## 2014-05-20 ENCOUNTER — Ambulatory Visit (INDEPENDENT_AMBULATORY_CARE_PROVIDER_SITE_OTHER): Payer: BLUE CROSS/BLUE SHIELD | Admitting: Pulmonary Disease

## 2014-06-29 ENCOUNTER — Ambulatory Visit (INDEPENDENT_AMBULATORY_CARE_PROVIDER_SITE_OTHER): Payer: BLUE CROSS/BLUE SHIELD | Admitting: Pulmonary Disease

## 2014-06-29 ENCOUNTER — Encounter: Payer: Self-pay | Admitting: Pulmonary Disease

## 2014-06-29 VITALS — BP 122/72 | HR 84 | Temp 97.1°F | Ht 69.0 in | Wt 226.0 lb

## 2014-06-29 DIAGNOSIS — R0609 Other forms of dyspnea: Secondary | ICD-10-CM

## 2014-06-29 LAB — PULMONARY FUNCTION TEST
DL/VA % PRED: 106 %
DL/VA: 4.87 ml/min/mmHg/L
DLCO UNC % PRED: 103 %
DLCO unc: 31.95 ml/min/mmHg
FEF 25-75 PRE: 2.31 L/s
FEF 25-75 Post: 2.82 L/sec
FEF2575-%CHANGE-POST: 22 %
FEF2575-%PRED-POST: 86 %
FEF2575-%Pred-Pre: 70 %
FEV1-%CHANGE-POST: 5 %
FEV1-%Pred-Post: 97 %
FEV1-%Pred-Pre: 92 %
FEV1-PRE: 3.44 L
FEV1-Post: 3.63 L
FEV1FVC-%Change-Post: 5 %
FEV1FVC-%Pred-Pre: 92 %
FEV6-%Change-Post: 0 %
FEV6-%PRED-POST: 103 %
FEV6-%PRED-PRE: 102 %
FEV6-POST: 4.81 L
FEV6-PRE: 4.77 L
FEV6FVC-%CHANGE-POST: 0 %
FEV6FVC-%PRED-PRE: 102 %
FEV6FVC-%Pred-Post: 103 %
FVC-%Change-Post: 0 %
FVC-%PRED-PRE: 100 %
FVC-%Pred-Post: 100 %
FVC-POST: 4.84 L
FVC-Pre: 4.83 L
POST FEV6/FVC RATIO: 100 %
PRE FEV6/FVC RATIO: 99 %
Post FEV1/FVC ratio: 75 %
Pre FEV1/FVC ratio: 71 %
RV % pred: 71 %
RV: 1.45 L
TLC % pred: 94 %
TLC: 6.43 L

## 2014-06-29 NOTE — Assessment & Plan Note (Signed)
The patient's pulmonary function studies are surprisingly totally normal. This combined with a normal cardiac workup makes me think that his symptoms are primarily related to obesity and deconditioning. Spine to him that he can still develop asthmatic bronchitis secondary to airway inflammation from smoking, and that he needs to work on total smoking cessation.

## 2014-06-29 NOTE — Progress Notes (Signed)
   Subjective:    Patient ID: Jason Orozco, male    DOB: 22-Apr-1960, 54 y.o.   MRN: [id number]  HPI The patient comes in today for follow-up of his recent PFTs, done as part of a workup for dyspnea on exertion. Found to have no airflow obstruction, no restriction, and had totally normal diffusion capacity. I have reviewed his study with him in detail, and answered all of his questions.   Review of Systems  Constitutional: Negative for fever and unexpected weight change.  HENT: Negative for congestion, dental problem, ear pain, nosebleeds, postnasal drip, rhinorrhea, sinus pressure, sneezing, sore throat and trouble swallowing.   Eyes: Negative for redness and itching.  Respiratory: Positive for shortness of breath. Negative for cough, chest tightness and wheezing.   Cardiovascular: Negative for palpitations and leg swelling.  Gastrointestinal: Negative for nausea and vomiting.  Genitourinary: Negative for dysuria.  Musculoskeletal: Negative for joint swelling.  Skin: Negative for rash.  Neurological: Negative for headaches.  Hematological: Does not bruise/bleed easily.  Psychiatric/Behavioral: Negative for dysphoric mood. The patient is not nervous/anxious.        Objective:   Physical Exam Obese male in no acute distress Nose without purulence or discharge noted Neck without lymphadenopathy or thyromegaly Lower extremities without edema, no cyanosis Alert and oriented, moves all 4 extremities.       Assessment & Plan:

## 2014-06-29 NOTE — Progress Notes (Signed)
PFT done today. 

## 2014-06-29 NOTE — Patient Instructions (Signed)
Your breathing studies are totally normal.  Great news Work on weight loss and some type of exercise program Work on smoking cessation followup with me as needed.

## 2015-01-19 ENCOUNTER — Other Ambulatory Visit: Payer: Self-pay | Admitting: Internal Medicine

## 2015-03-03 ENCOUNTER — Other Ambulatory Visit: Payer: Self-pay | Admitting: Urgent Care

## 2015-07-23 IMAGING — CT CT ABD-PELV W/ CM
2 of 5 series · 17 of 46 positions shown, 19 images · IV contrast (OMNIPAQUE)
Comparison: None.

CLINICAL DATA: Abdominal pain.  Rectal bleeding.

EXAM:
CT ABDOMEN AND PELVIS WITH CONTRAST
TECHNIQUE: Multidetector CT imaging of the abdomen and pelvis was performed
using the standard protocol following bolus administration of
intravenous contrast.
CONTRAST:  100mL OMNIPAQUE IOHEXOL 300 MG/ML  SOLN

[Series 2: rtn a/p with · axial · 0.85mm/px · z∈[-304,+130]mm · 14 of 99 slices shown, 16 images]
[im 6/99  soft-tissue]
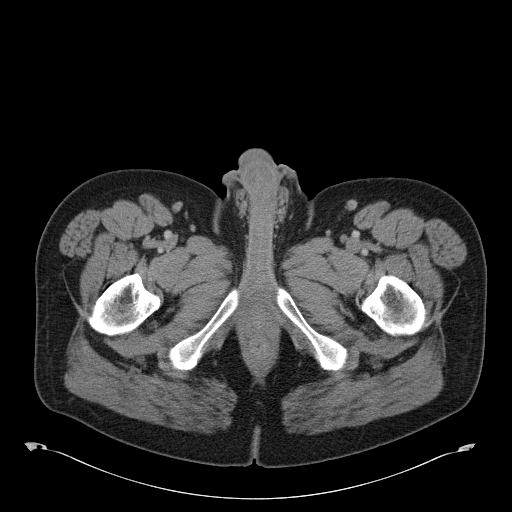
[im 6/99  bone]
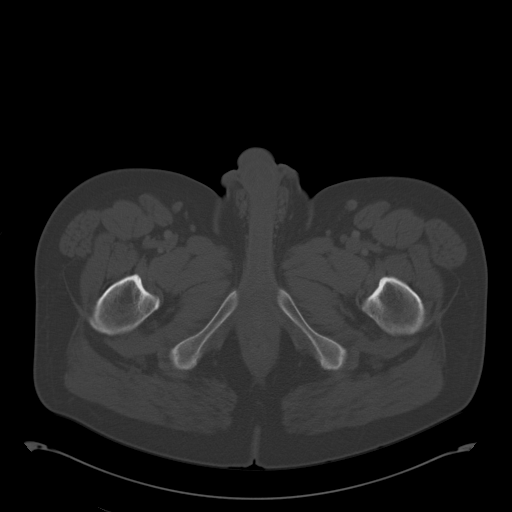
[im 11/99  soft-tissue]
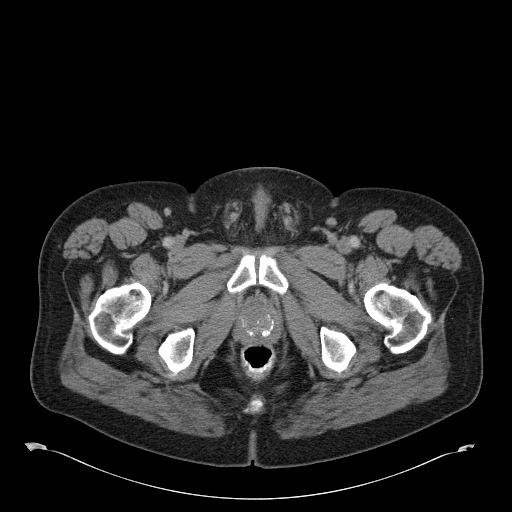
[im 22/99  soft-tissue]
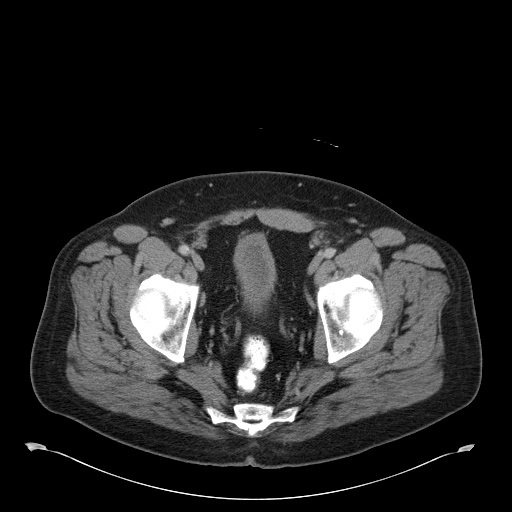
[im 28/99  soft-tissue]
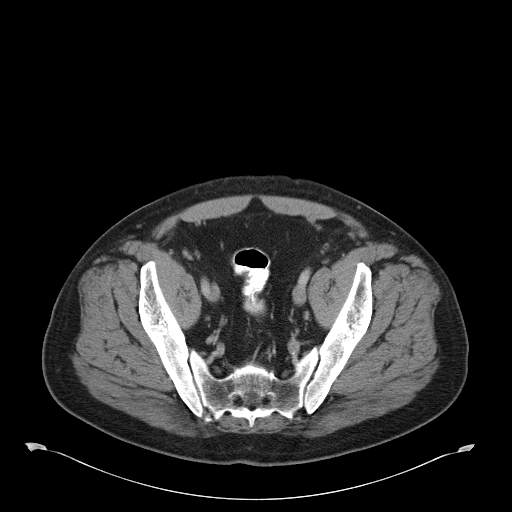
[im 33/99  soft-tissue]
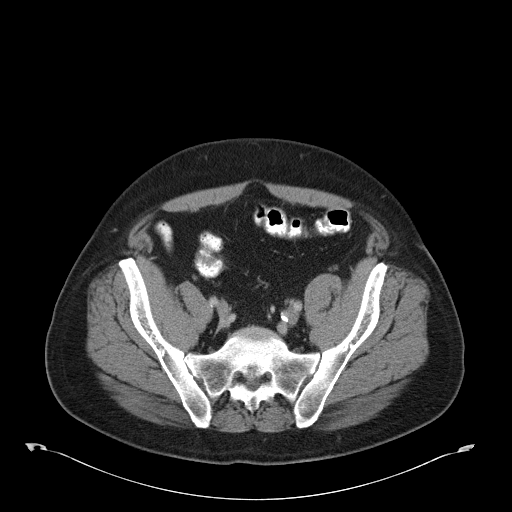
[im 39/99  soft-tissue]
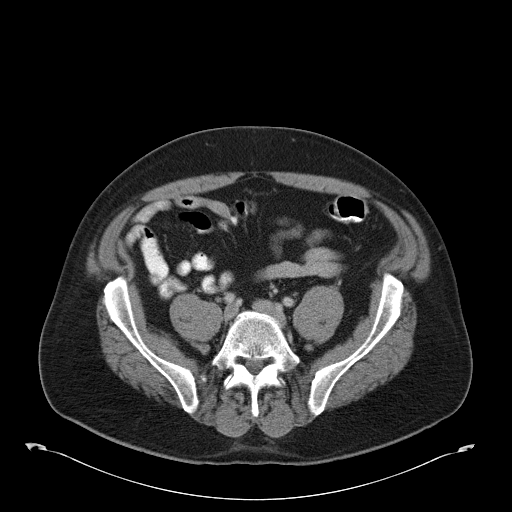
[im 44/99  soft-tissue]
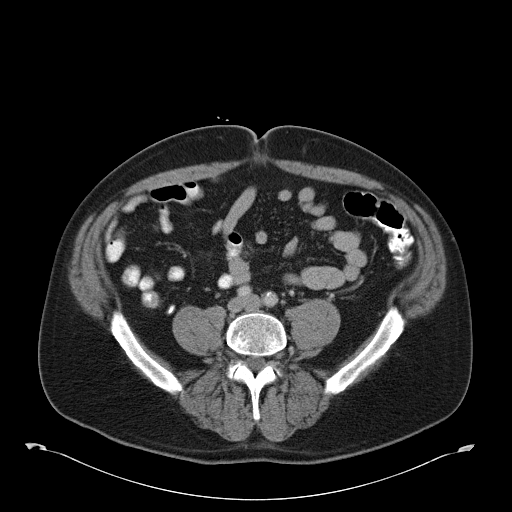
[im 55/99  soft-tissue]
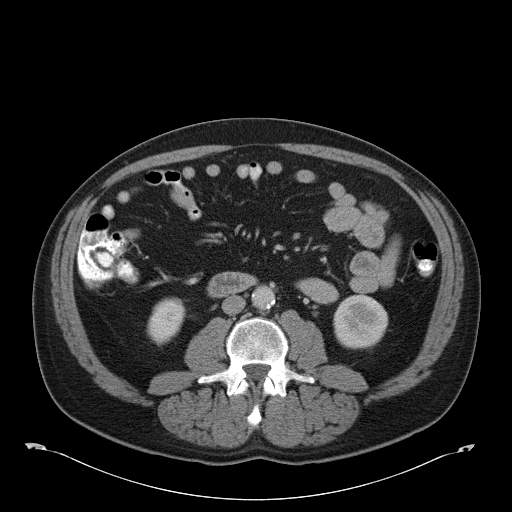
[im 60/99  soft-tissue]
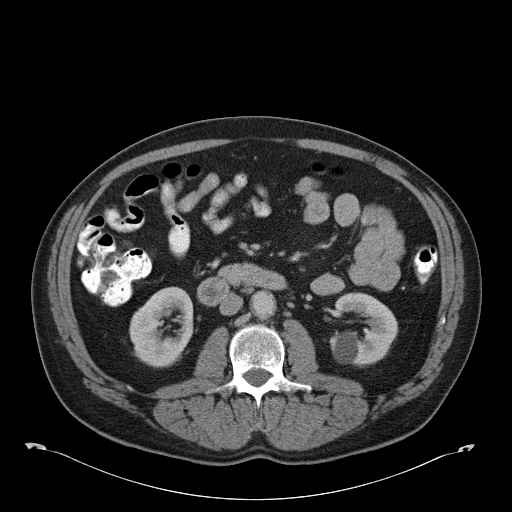
[im 60/99  bone]
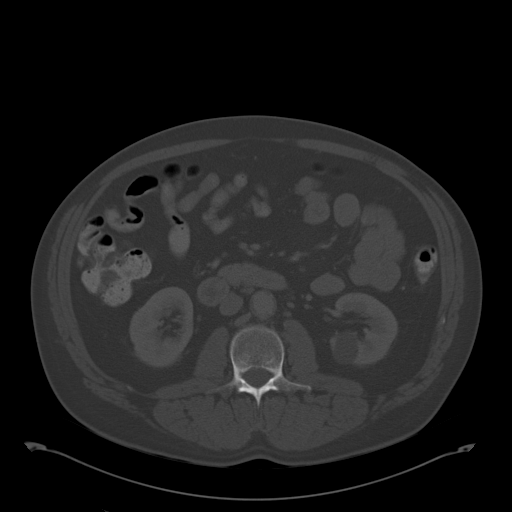
[im 66/99  soft-tissue]
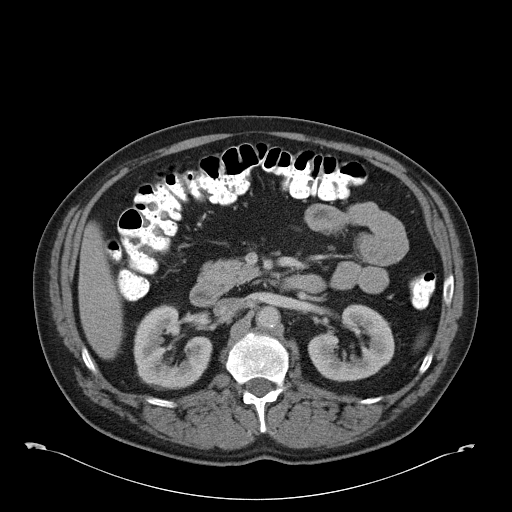
[im 71/99  soft-tissue]
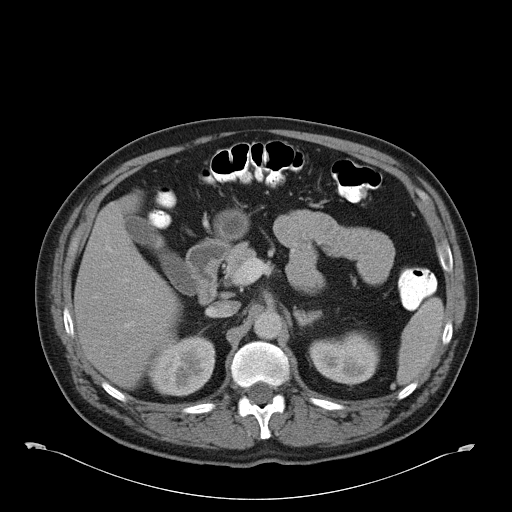
[im 77/99  soft-tissue]
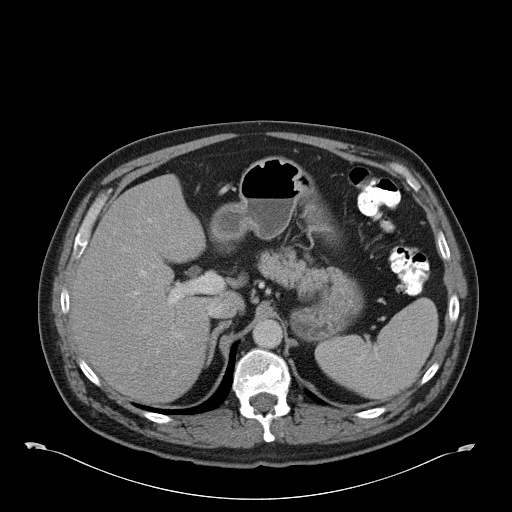
[im 88/99  soft-tissue]
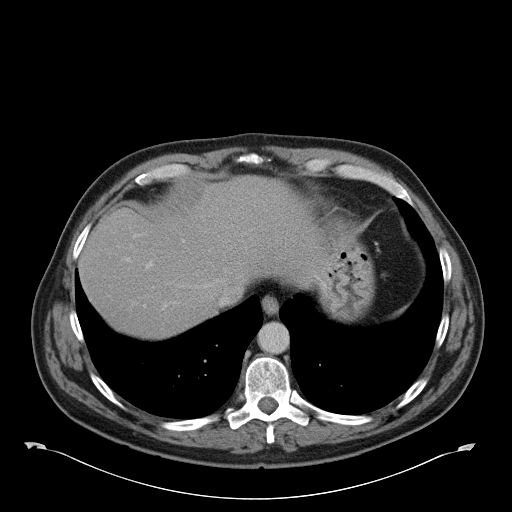
[im 93/99  soft-tissue]
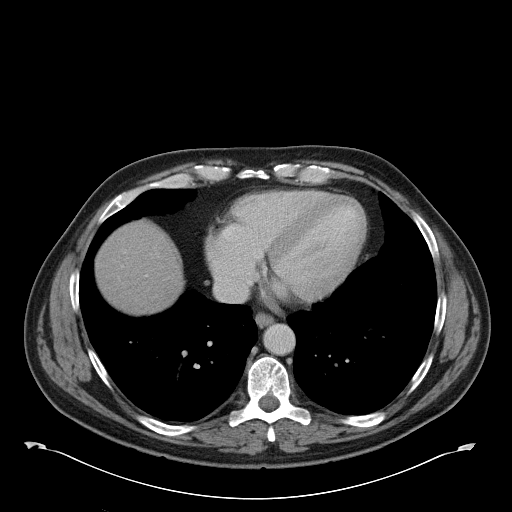

[Series 602: <mpr thick range> · coronal · 0.96mm/px · 3 of 99 slices shown]
[im 33/99  soft-tissue]
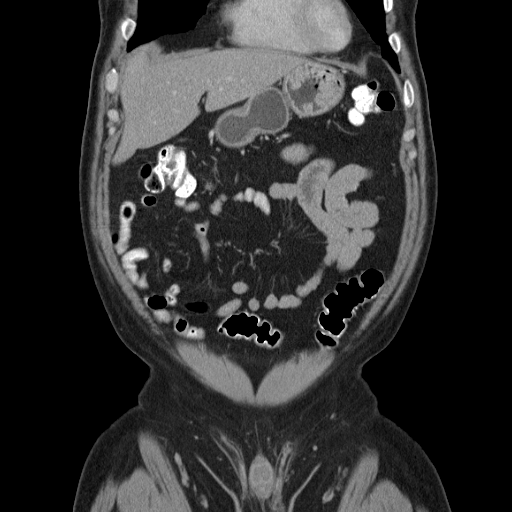
[im 44/99  soft-tissue]
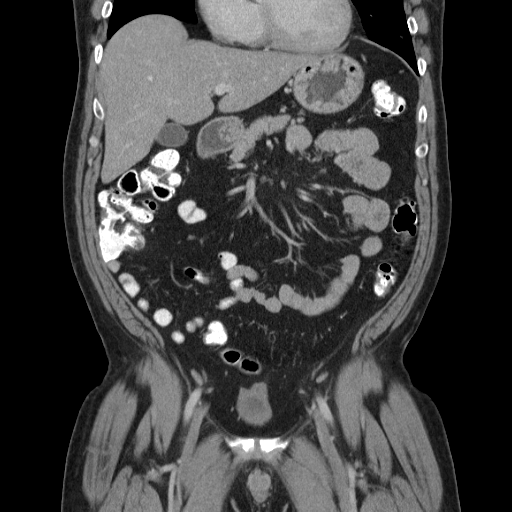
[im 55/99  soft-tissue]
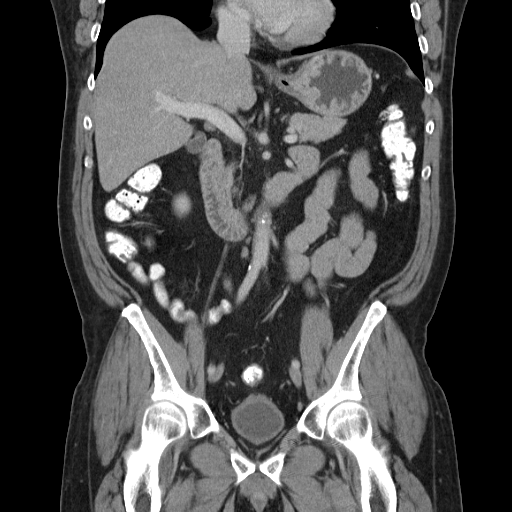

[17 of 46 positions shown; findings below may reference images not displayed]

FINDINGS: The liver, gallbladder, pancreas, spleen, and adrenal glands are
normal in appearance. Several benign renal cysts are noted
bilaterally. No evidence of renal masses or hydronephrosis. No other
soft tissue masses or lymphadenopathy identified within the abdomen
or pelvis.

The bowel is well opacified by oral contrast and there is no
evidence of wall thickening or masses. No evidence of bowel
obstruction. Appendix is normal in appearance. No evidence of
inflammatory process or abnormal fluid collections. Prostate and
seminal vesicles are unremarkable in appearance. No suspicious bone
lesions identified. No suspicious bone lesions identified.
IMPRESSION: No acute findings or other significant abnormality identified.

Benign bilateral renal cysts incidentally noted.

## 2017-05-21 ENCOUNTER — Ambulatory Visit (INDEPENDENT_AMBULATORY_CARE_PROVIDER_SITE_OTHER): Payer: BLUE CROSS/BLUE SHIELD | Admitting: Surgery

## 2017-05-21 ENCOUNTER — Encounter (INDEPENDENT_AMBULATORY_CARE_PROVIDER_SITE_OTHER): Payer: Self-pay | Admitting: Surgery

## 2017-05-21 ENCOUNTER — Ambulatory Visit (INDEPENDENT_AMBULATORY_CARE_PROVIDER_SITE_OTHER): Payer: BLUE CROSS/BLUE SHIELD

## 2017-05-21 DIAGNOSIS — G5692 Unspecified mononeuropathy of left upper limb: Secondary | ICD-10-CM | POA: Diagnosis not present

## 2017-05-21 DIAGNOSIS — M25522 Pain in left elbow: Secondary | ICD-10-CM

## 2017-05-21 NOTE — Progress Notes (Signed)
Office Visit Note   Patient: Jason Orozco           Date of Birth: 03-18-1961           MRN: [id number] Visit Date: 05/21/2017              Requested by: Octavio Graves, DO [address] [place], [place] [address] PCP: Penni Bombard, PA   Assessment & Plan: Visit Diagnoses:  1. Pain in left elbow   2. Neuropathy, arm, left     Plan: With patient's ongoing symptoms and failed conservative treatment at this point will schedule NCV/EMG study left upper extremity to rule out cervical radiculopathy, cubital tunnel, carpal tunnel.  Follow me after completion of the study to discuss results and further treatment options.  Follow-Up Instructions: Return in about 3 weeks (around 06/11/2017) for with Jeneen Rinks to review NCV/EMG (per [employer]).   Orders:  Orders Placed This Encounter  Procedures  . XR Elbow Complete Left (3+View)  . Ambulatory referral to Physical Medicine Rehab   No orders of the defined types were placed in this encounter.     Procedures: No procedures performed   Clinical Data: No additional findings.   Subjective: Chief Complaint  Patient presents with  . Left Elbow - Pain    HPI Patient comes in today with complaints of left arm pain numbness and tingling.  States that his symptoms been going on worsening times 2 months.  has pain burning numbness and tingling medial aspect of his elbow that extends in his forearm and hand also medial biceps area.  No complaints of weakness.  No injury. Review of Systems No current cardiac pulmonary GI GU issues  Objective: Vital Signs: There were no vitals taken for this visit.  Physical Exam  Constitutional: He is oriented to person, place, and time. No distress.  HENT:  Head: Normocephalic and atraumatic.  Eyes: Pupils are equal, round, and reactive to light.  Neck: Normal range of motion.  Pulmonary/Chest: No respiratory distress.  Abdominal: He exhibits no distension.  Musculoskeletal:  Exam left shoulder  unremarkable.  Left elbow he has good range of motion but with some discomfort medial aspect.  Tender over the cubital tunnel.  Positive Tinel's.  Pain with elbow flexion test.  Left wrist mildly positive Tinel's.  No focal motor deficits.  Neurovascular intact.  Neurological: He is alert and oriented to person, place, and time.  Skin: Skin is warm and dry.    Ortho Exam  Specialty Comments:  No specialty comments available.  Imaging: No results found.   PMFS History: Patient Active Problem List   Diagnosis Date Noted  . DOE (dyspnea on exertion) 05/02/2014   Past Medical History:  Diagnosis Date  . Anxiety   . Brain tumor ([place])   . Chronic back pain   . High cholesterol     Family History  Problem Relation Age of Onset  . Heart attack Mother   . Diabetes Father   . Cancer Maternal Grandfather     Past Surgical History:  Procedure Laterality Date  . BRAIN TUMOR EXCISION     Social History   Occupational History  . Occupation: Chief Financial Officer  Tobacco Use  . Smoking status: Current Every Day Smoker    Packs/day: 0.25    Years: 30.00    Pack years: 7.50    Types: Cigarettes  Substance and Sexual Activity  . Alcohol use: No    Alcohol/week: 0.0 oz  . Drug  use: No  . Sexual activity: Not on file

## 2017-06-27 ENCOUNTER — Encounter (INDEPENDENT_AMBULATORY_CARE_PROVIDER_SITE_OTHER): Payer: Self-pay | Admitting: Physical Medicine and Rehabilitation
